# Patient Record
Sex: Male | Born: 1965 | Race: White | Hispanic: No | Marital: Married | Smoking: Current every day smoker
Health system: Southern US, Community
[De-identification: ages and names within clinical notes are randomized; demographics above are authoritative.]

---

## 2016-05-21 ENCOUNTER — Other Ambulatory Visit: Payer: Self-pay | Admitting: Family Medicine

## 2016-05-21 ENCOUNTER — Encounter: Payer: Self-pay | Admitting: Family Medicine

## 2016-05-21 ENCOUNTER — Ambulatory Visit (INDEPENDENT_AMBULATORY_CARE_PROVIDER_SITE_OTHER): Payer: Worker's Compensation

## 2016-05-21 ENCOUNTER — Ambulatory Visit (INDEPENDENT_AMBULATORY_CARE_PROVIDER_SITE_OTHER): Payer: Worker's Compensation | Admitting: Family Medicine

## 2016-05-21 VITALS — BP 135/91 | HR 82 | Temp 98.6°F | Ht 64.0 in | Wt 133.2 lb

## 2016-05-21 DIAGNOSIS — S60945A Unspecified superficial injury of left ring finger, initial encounter: Secondary | ICD-10-CM

## 2016-05-21 DIAGNOSIS — T148XXA Other injury of unspecified body region, initial encounter: Secondary | ICD-10-CM

## 2016-05-21 DIAGNOSIS — S6992XA Unspecified injury of left wrist, hand and finger(s), initial encounter: Secondary | ICD-10-CM

## 2016-05-21 MED ORDER — MUPIROCIN 2 % EX OINT: 1.0000 "application " | TOPICAL_OINTMENT | Freq: Two times a day (BID) | CUTANEOUS | Status: AC

## 2016-05-21 NOTE — Addendum Note (Signed)
Addended by: Angela AdamOSTOSKY, Angeliyah Kirkey C on: 05/21/2016 04:44 PM   Modules accepted: Orders

## 2016-05-21 NOTE — Patient Instructions (Signed)
Great to meet you!  I'm glad to say there is no fracture, please come back with any concerns

## 2016-05-21 NOTE — Progress Notes (Signed)
   HPI  Patient presents today here for Worker's Comp. evaluation for finger injury.  She explains that on July 12 he was working at work when he smashed his left ring finger between 2 pieces of metal. Because a laceration which he bandaged and continued working with. He states that it's been difficult to bend since that time but overall does not seem inappropriate for the injury.  His wedding ring is unable to be removed, he does not want it removed, it's loose and not hurting him.   PMH: Smoking status noted ROS: Per HPI  Objective: There were no vitals taken for this visit. Gen: NAD, alert, cooperative with exam HEENT: NCAT CV: RRR, good S1/S2, no murmur Resp: CTABL, no wheezes, non-labored Ext: No edema, warm Neuro: Alert and oriented, No gross deficits  Skin Left fourth finger with 15 mm laceration that easily well approximated and not bleeding over the distal interphalangeal joint. Mild swelling, limitations in full flexion because of pain  Small amount of clear drainage on that laceration, macerated skin   Assessment and plan:  # Left fourth finger injury Small laceration that appears noninfected Apply mupirocin ointment twice daily for 5-7 days Return to clinic with any concerns, otherwise no limitations and working. He feels he go back to work without any problems.  X-ray rules out fracture.   Murtis SinkSam Guinn Delarosa, MD [facility] 05/21/2016, 4:18 PM

## 2017-02-19 IMAGING — DX DG FINGER RING 2+V*L*
3 series · 3 of 3 positions shown · non-contrast
Comparison: None.

CLINICAL DATA: Crushed fourth finger

EXAM:
LEFT RING FINGER 2+V

[finger ap]
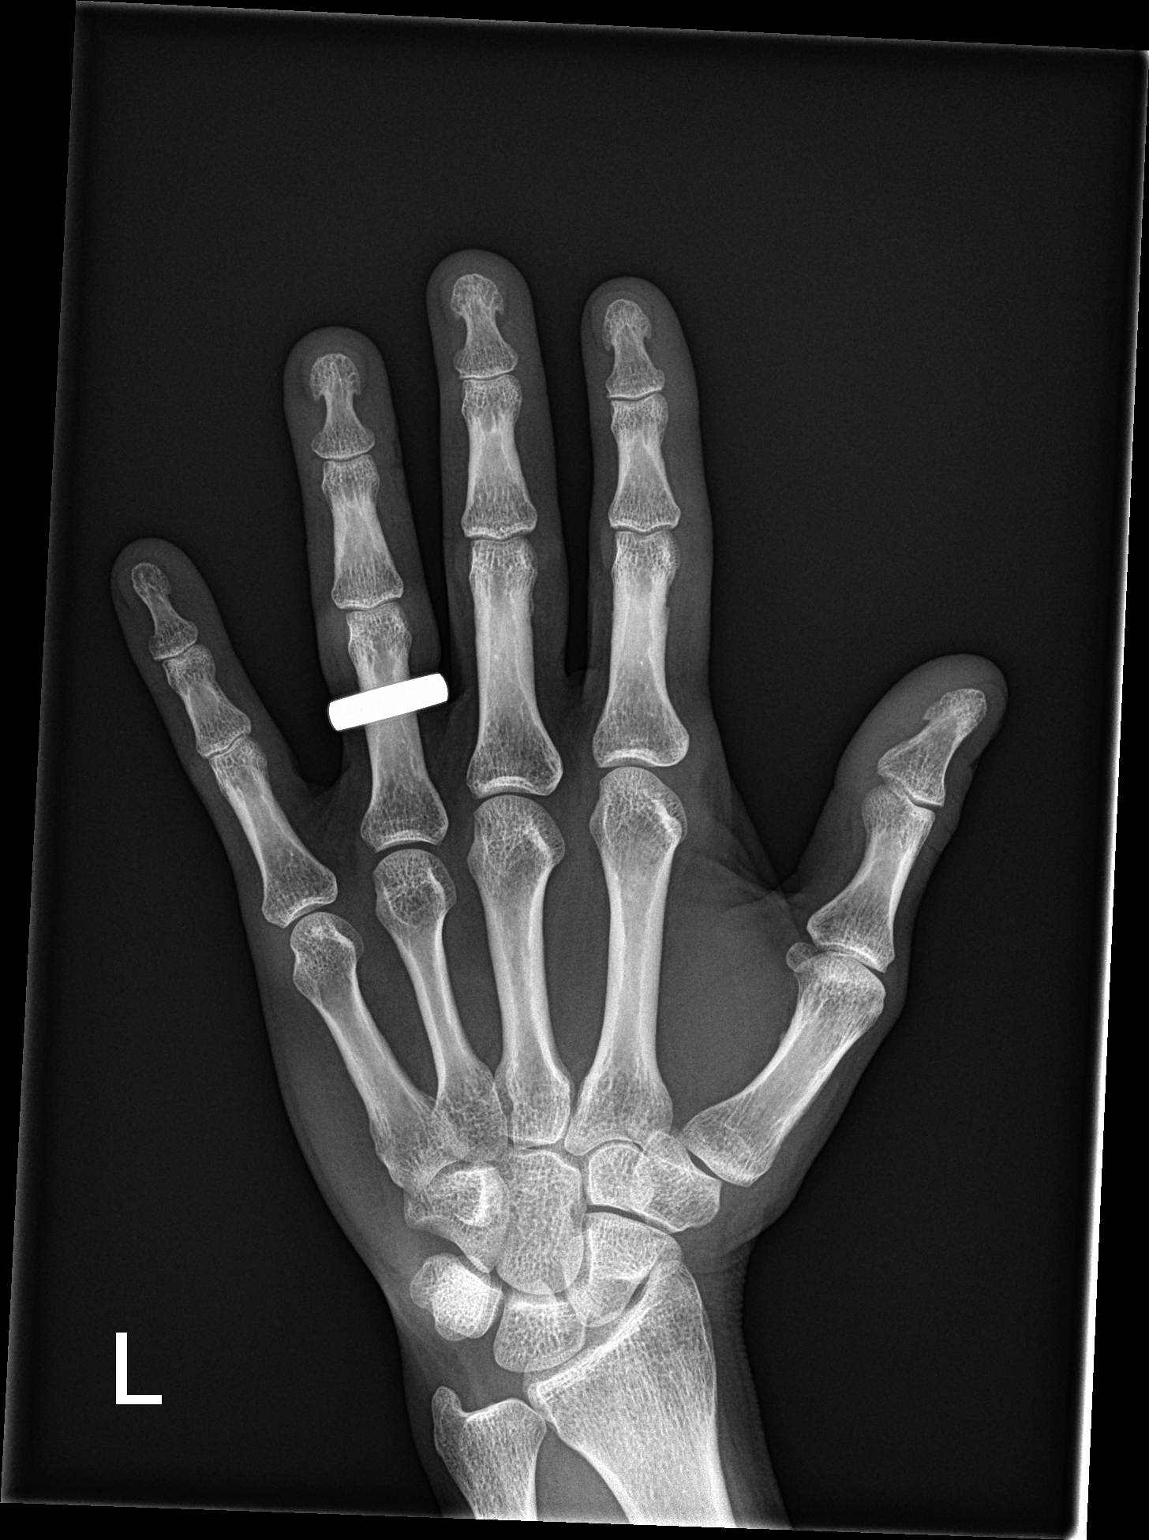

[finger obl]
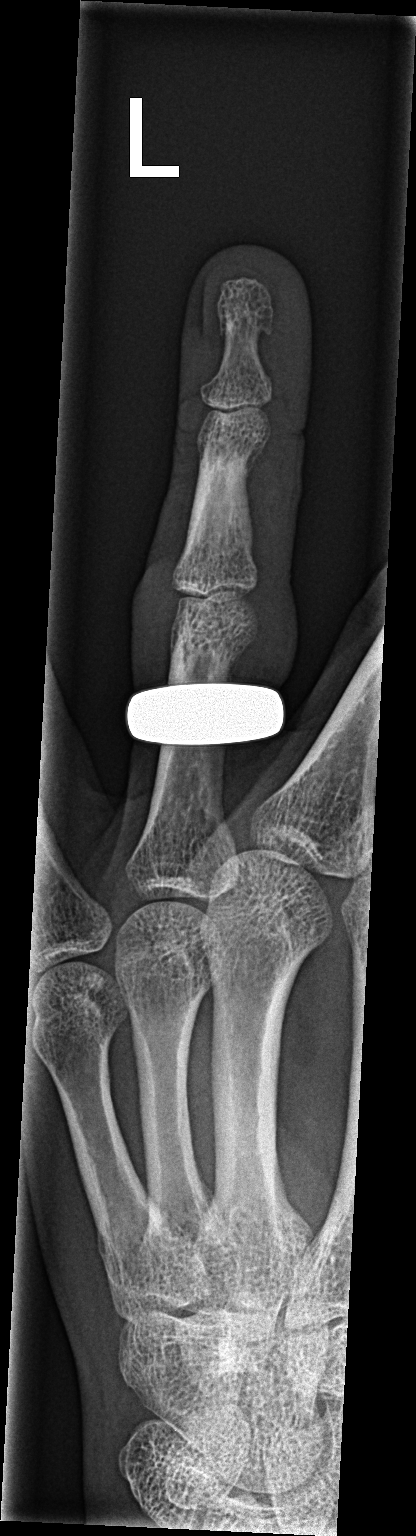

[finger lat]
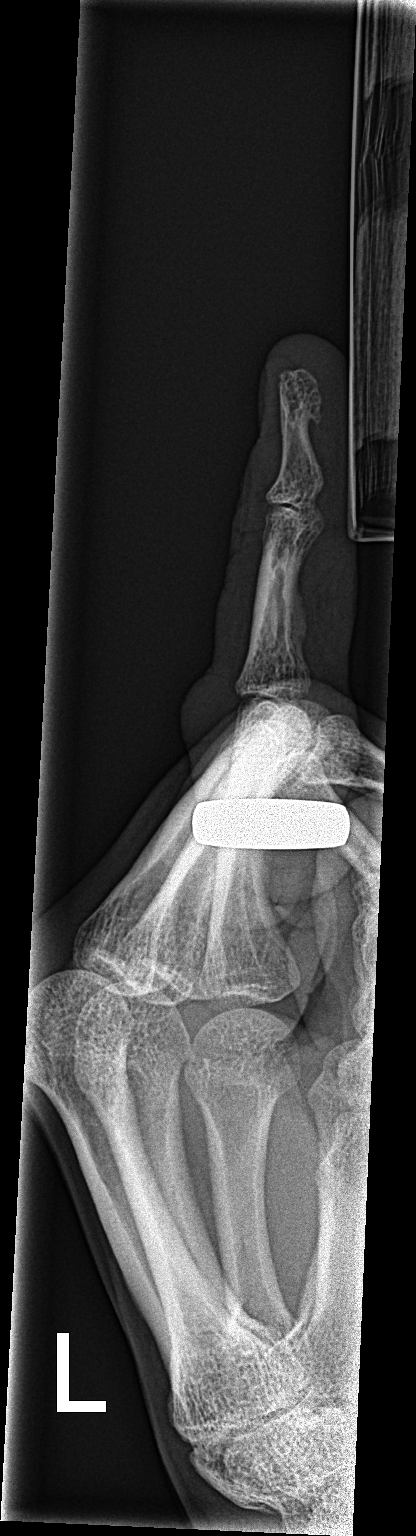

[3 of 3 positions shown; findings below may reference images not displayed]

FINDINGS: No acute fracture is seen. Alignment is normal. Joint spaces appear
normal. The patient ring was not removed overlying the mid proximal
phalanx of the left fourth digit.
IMPRESSION: No acute fracture is seen.

## 2017-09-22 ENCOUNTER — Ambulatory Visit (INDEPENDENT_AMBULATORY_CARE_PROVIDER_SITE_OTHER): Payer: Worker's Compensation | Admitting: Family Medicine

## 2017-09-22 ENCOUNTER — Encounter: Payer: Self-pay | Admitting: Family Medicine

## 2017-09-22 ENCOUNTER — Ambulatory Visit (INDEPENDENT_AMBULATORY_CARE_PROVIDER_SITE_OTHER): Payer: Worker's Compensation

## 2017-09-22 VITALS — BP 132/84 | HR 90 | Temp 97.9°F | Ht 64.0 in | Wt 139.0 lb

## 2017-09-22 DIAGNOSIS — R0781 Pleurodynia: Secondary | ICD-10-CM

## 2017-09-22 DIAGNOSIS — S2231XA Fracture of one rib, right side, initial encounter for closed fracture: Secondary | ICD-10-CM | POA: Diagnosis not present

## 2017-09-22 NOTE — Progress Notes (Signed)
BP 132/84   Pulse 90   Temp 97.9 F (36.6 C) (Oral)   Ht 5\' 4"  (1.626 m)   Wt 139 lb (63 kg)   BMI 23.86 kg/m    Subjective:    Patient ID: Zachary Fox, male    DOB: 05-10-1966, 51 y.o.   MRN: [id number]  HPI: Zachary Fox is a 51 y.o. male presenting on 09/22/2017 for Worker's Comp - Pain in right rib area (fell yesterday onto right side)   HPI Right rib pain/Worker's Comp.  Patient is coming in for right rib pain/Worker's Comp.  He works for [employer] and the accident occurred yesterday at 09/21/2017.  He says that he tripped over a band and landed hard on his right side of his arm on the concrete which then smashed into his ribs.  Initially he was not that sore but then overnight last night he was having a lot of pain in that right side of his ribs and he could not sleep and that is what is bringing him in today.  He denies any fevers or chills or cough or shortness of breath or wheezing.  He denies any pain radiating anywhere else.  He says the pain can be quite severe especially with deep inspiration and coughing and palpation.  Relevant past medical, surgical, family and social history reviewed and updated as indicated. Interim medical history since our last visit reviewed. Allergies and medications reviewed and updated.  Review of Systems  Constitutional: Negative for chills and fever.  Respiratory: Negative for shortness of breath and wheezing.   Cardiovascular: Negative for chest pain and leg swelling.  Musculoskeletal: Positive for arthralgias. Negative for back pain and gait problem.  Skin: Negative for rash.  All other systems reviewed and are negative.   Per HPI unless specifically indicated above     Objective:    BP 132/84   Pulse 90   Temp 97.9 F (36.6 C) (Oral)   Ht 5\' 4"  (1.626 m)   Wt 139 lb (63 kg)   BMI 23.86 kg/m   Wt Readings from Last 3 Encounters:  09/22/17 139 lb (63 kg)  05/21/16 133 lb 3.2 oz (60.4 kg)    Physical Exam    Constitutional: He is oriented to person, place, and time. He appears well-developed and well-nourished. No distress.  Cardiovascular: Normal rate, regular rhythm, normal heart sounds and intact distal pulses.  No murmur heard. Pulmonary/Chest: Effort normal and breath sounds normal. No respiratory distress. He has no wheezes.  Musculoskeletal: Normal range of motion. He exhibits tenderness. He exhibits no edema.       Arms: Neurological: He is alert and oriented to person, place, and time. Coordination normal.  Skin: Skin is warm and dry. No rash noted. He is not diaphoretic.  Psychiatric: He has a normal mood and affect. His behavior is normal.  Nursing note and vitals reviewed.   No results found for this or any previous visit.    Assessment & Plan:   Problem List Items Addressed This Visit    None    Visit Diagnoses    Closed traumatic nondisplaced fracture of one rib of right side, initial encounter    -  Primary   Relevant Orders   DG Ribs Unilateral W/Chest Right (Completed)     We will give 2 days off work and recommended compression band over his chest.  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  Recommend no  major lifting for at least a week  Follow up plan: Return if symptoms worsen or fail to improve.  Counseling provided for all of the vaccine components Orders Placed This Encounter  Procedures  . DG Ribs Unilateral W/Chest Right    Arville CareJoshua , MD [facility] 09/22/2017, 2:34 PM

## 2018-06-23 IMAGING — DX DG RIBS W/ CHEST 3+V*R*
3 series · 3 of 3 positions shown · non-contrast
Comparison: None.

CLINICAL DATA: Rib pain status post fall

EXAM:
RIGHT RIBS AND CHEST - 3+ VIEW

[chest pa]
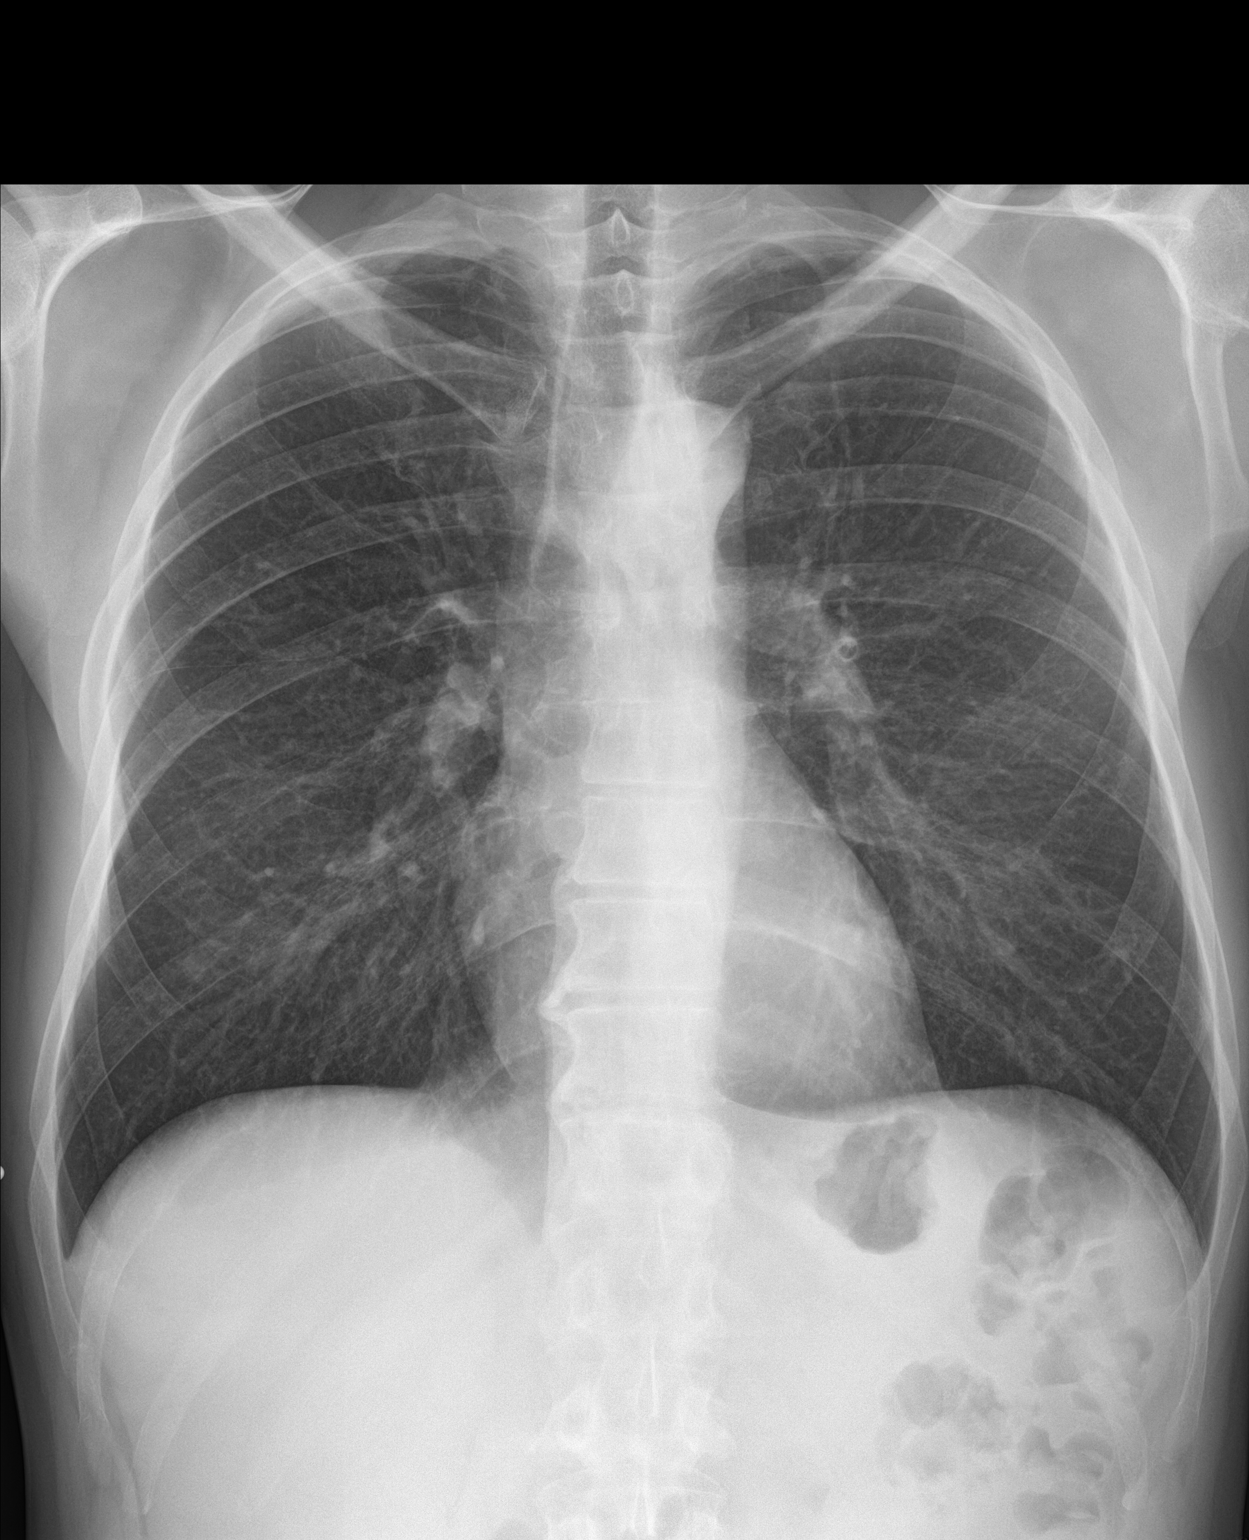

[rib obl (1 of 2)]
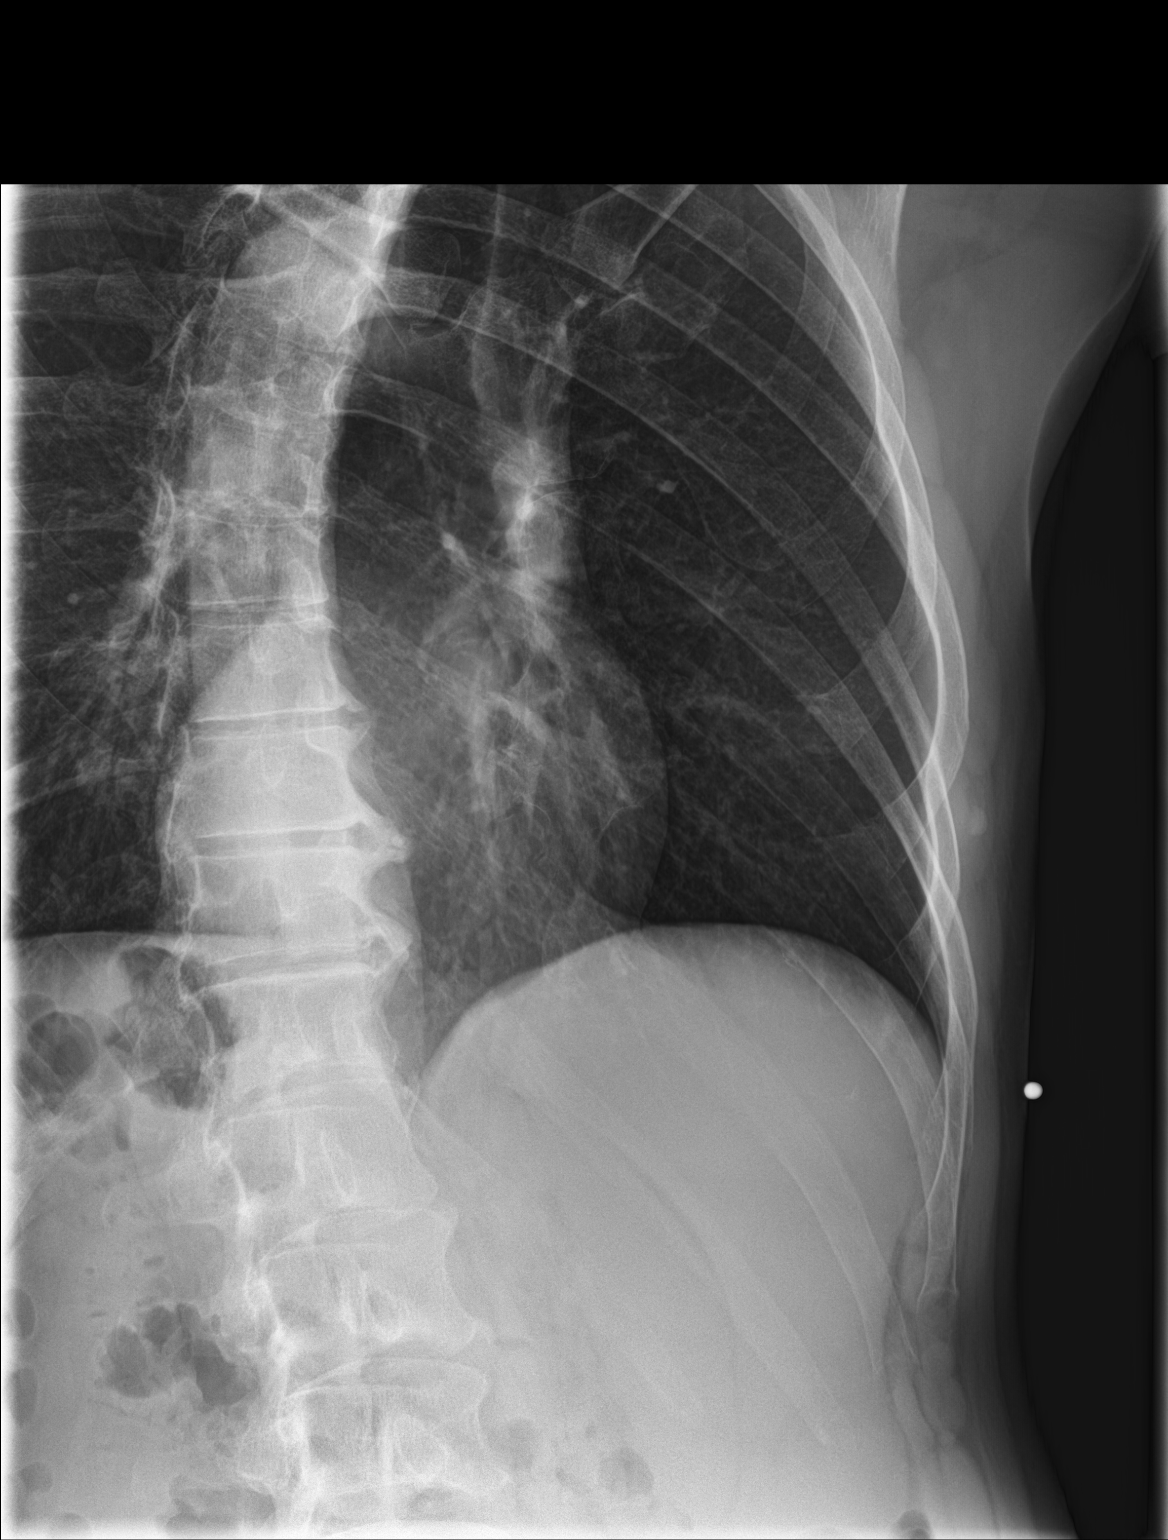

[rib obl (2 of 2)]
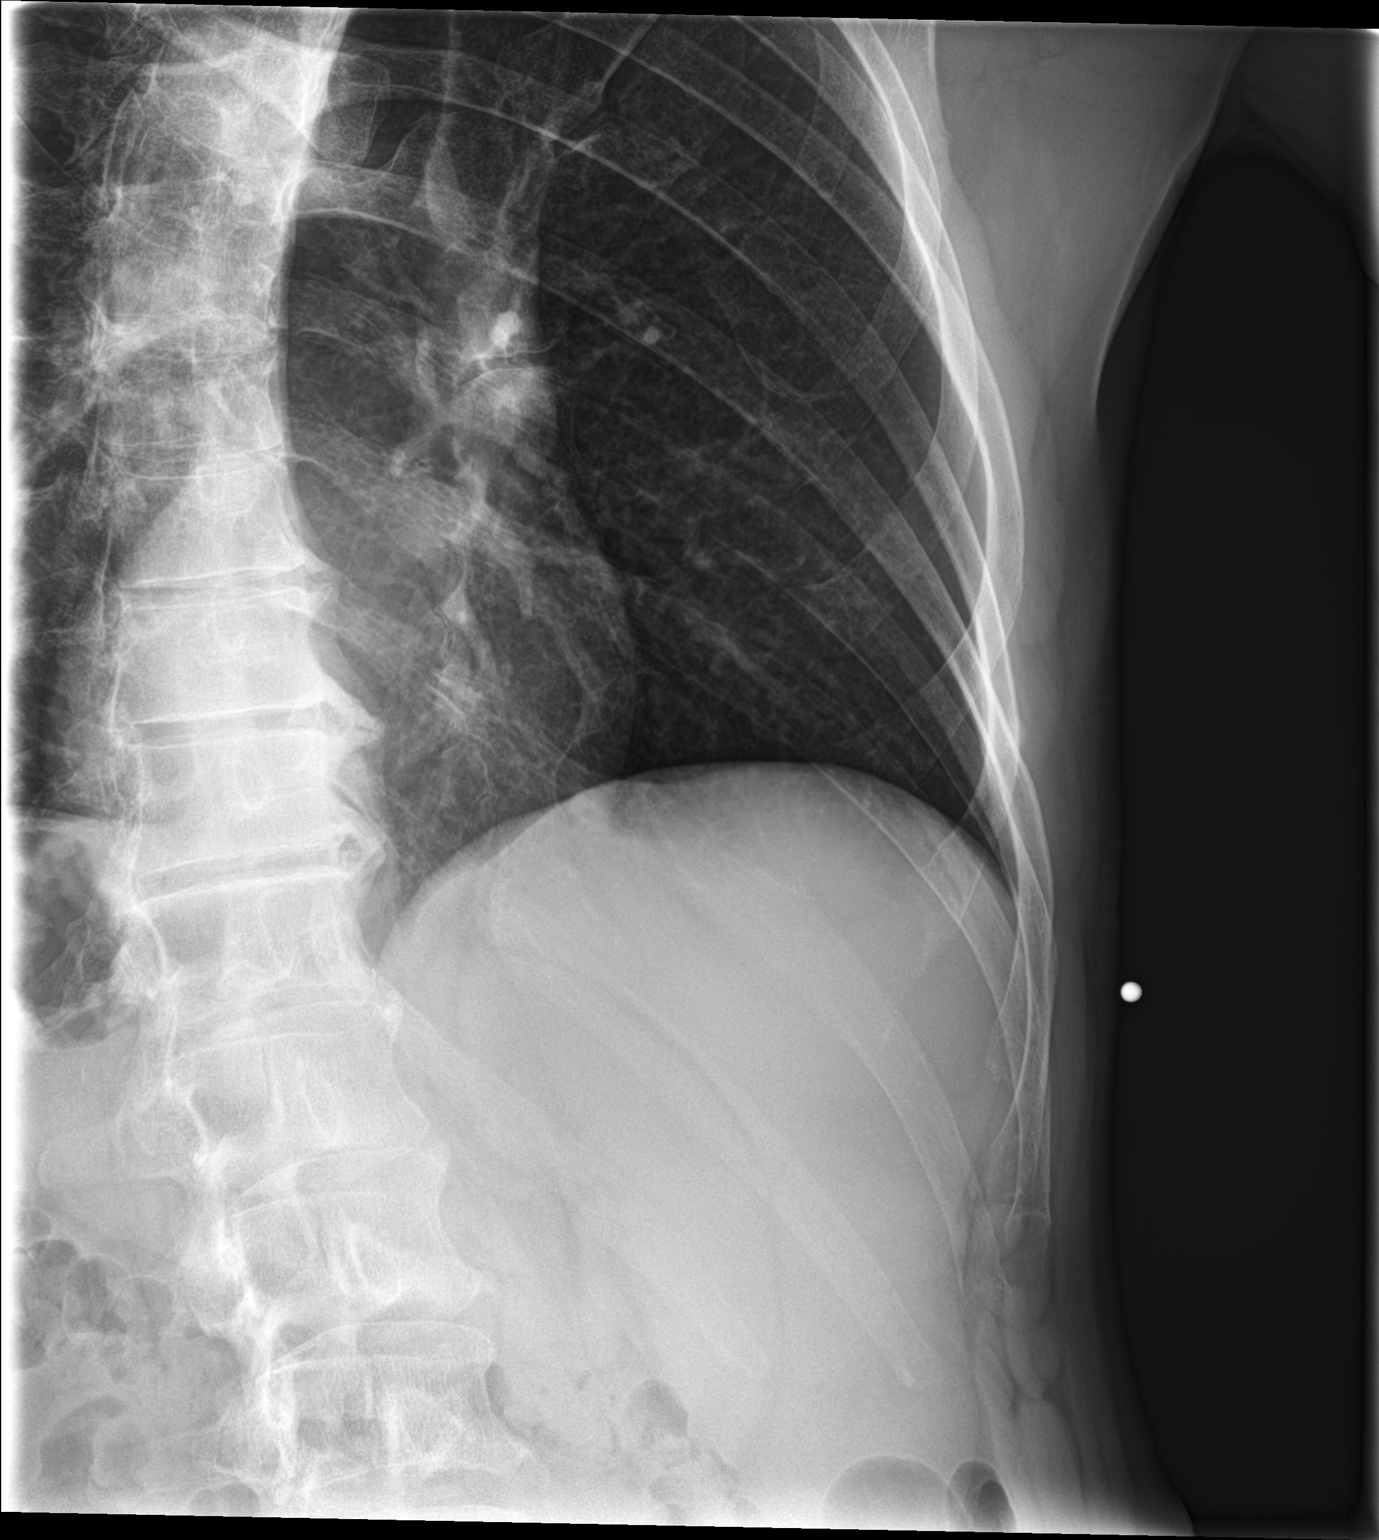

[3 of 3 positions shown; findings below may reference images not displayed]

FINDINGS: Lungs are clear.  No pleural effusion or pneumothorax.

The heart is normal in size.

No displaced right rib fracture is seen.
IMPRESSION: No evidence of acute cardiopulmonary disease.

No displaced right rib fracture is seen.

## 2024-01-08 DEATH — deceased
